# Patient Record
Sex: Male | Born: 1985 | Race: White | Hispanic: No | Marital: Single | Smoking: Current every day smoker
Health system: Southern US, Community
[De-identification: ages and names within clinical notes are randomized; demographics above are authoritative.]

---
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
## 2007-06-05 ENCOUNTER — Emergency Department: Payer: Self-pay | Admitting: Emergency Medicine

## 2007-09-24 ENCOUNTER — Emergency Department: Payer: Self-pay | Admitting: Emergency Medicine

## 2010-09-28 ENCOUNTER — Emergency Department: Payer: Self-pay | Admitting: Internal Medicine

## 2011-08-03 ENCOUNTER — Emergency Department: Payer: Self-pay | Admitting: *Deleted

## 2020-06-28 ENCOUNTER — Emergency Department
Admission: EM | Admit: 2020-06-28 | Discharge: 2020-06-28 | Disposition: A | Discharge status: Home / Self Care | Payer: Self-pay | Attending: Emergency Medicine | Admitting: Emergency Medicine

## 2020-06-28 ENCOUNTER — Other Ambulatory Visit: Payer: Self-pay

## 2020-06-28 ENCOUNTER — Encounter: Payer: Self-pay | Admitting: Emergency Medicine

## 2020-06-28 ENCOUNTER — Emergency Department: Payer: Self-pay

## 2020-06-28 DIAGNOSIS — K852 Alcohol induced acute pancreatitis without necrosis or infection: Secondary | ICD-10-CM | POA: Insufficient documentation

## 2020-06-28 DIAGNOSIS — K2920 Alcoholic gastritis without bleeding: Secondary | ICD-10-CM | POA: Insufficient documentation

## 2020-06-28 DIAGNOSIS — F17200 Nicotine dependence, unspecified, uncomplicated: Secondary | ICD-10-CM | POA: Insufficient documentation

## 2020-06-28 LAB — URINALYSIS, COMPLETE (UACMP) WITH MICROSCOPIC
Bacteria, UA: NONE SEEN
Glucose, UA: NEGATIVE mg/dL
Hgb urine dipstick: NEGATIVE
Ketones, ur: 5 mg/dL — AB
Leukocytes,Ua: NEGATIVE
Nitrite: NEGATIVE
Protein, ur: 100 mg/dL — AB
Specific Gravity, Urine: 1.044 — ABNORMAL HIGH (ref 1.005–1.030)
pH: 5 (ref 5.0–8.0)

## 2020-06-28 LAB — COMPREHENSIVE METABOLIC PANEL
ALT: 15 U/L (ref 0–44)
AST: 21 U/L (ref 15–41)
Albumin: 4.7 g/dL (ref 3.5–5.0)
Alkaline Phosphatase: 82 U/L (ref 38–126)
Anion gap: 10 (ref 5–15)
BUN: 12 mg/dL (ref 6–20)
CO2: 26 mmol/L (ref 22–32)
Calcium: 9.7 mg/dL (ref 8.9–10.3)
Chloride: 100 mmol/L (ref 98–111)
Creatinine, Ser: 0.87 mg/dL (ref 0.61–1.24)
GFR, Estimated: >60 mL/min (ref >60)
Glucose, Bld: 141 mg/dL — ABNORMAL HIGH (ref 70–99)
Potassium: 4.3 mmol/L (ref 3.5–5.1)
Sodium: 136 mmol/L (ref 135–145)
Total Bilirubin: 0.9 mg/dL (ref 0.3–1.2)
Total Protein: 8.5 g/dL — ABNORMAL HIGH (ref 6.5–8.1)

## 2020-06-28 LAB — LIPASE, BLOOD: Lipase: 126 U/L — ABNORMAL HIGH (ref 11–51)

## 2020-06-28 LAB — CBC
HCT: 48.4 % (ref 39.0–52.0)
Hemoglobin: 16.4 g/dL (ref 13.0–17.0)
MCH: 29.9 pg (ref 26.0–34.0)
MCHC: 33.9 g/dL (ref 30.0–36.0)
MCV: 88.3 fL (ref 80.0–100.0)
Platelets: 216 10*3/uL (ref 150–400)
RBC: 5.48 MIL/uL (ref 4.22–5.81)
RDW: 12.5 % (ref 11.5–15.5)
WBC: 10.2 10*3/uL (ref 4.0–10.5)
nRBC: 0 % (ref 0.0–0.2)

## 2020-06-28 MED ORDER — FAMOTIDINE 20 MG PO TABS: 20.0000 mg | ORAL_TABLET | Freq: Two times a day (BID) | ORAL | 0 refills | Status: DC

## 2020-06-28 MED ORDER — DEXTROSE 5 % AND 0.9 % NACL IV BOLUS
1000.0000 mL | Freq: Once | INTRAVENOUS | Status: AC
  Administered 2020-06-28: 1000 mL via INTRAVENOUS
  Filled 2020-06-28: qty 1000

## 2020-06-28 MED ORDER — ONDANSETRON 4 MG PO TBDP: 4.0000 mg | ORAL_TABLET | Freq: Three times a day (TID) | ORAL | 0 refills | Status: DC | PRN

## 2020-06-28 MED ORDER — KETOROLAC TROMETHAMINE 30 MG/ML IJ SOLN
15.0000 mg | INTRAMUSCULAR | Status: AC
  Administered 2020-06-28: 15 mg via INTRAVENOUS
  Filled 2020-06-28: qty 1

## 2020-06-28 MED ORDER — ONDANSETRON HCL 4 MG/2ML IJ SOLN
4.0000 mg | Freq: Once | INTRAMUSCULAR | Status: AC
  Administered 2020-06-28: 4 mg via INTRAVENOUS
  Filled 2020-06-28: qty 2

## 2020-06-28 MED ORDER — PANTOPRAZOLE SODIUM 40 MG IV SOLR
40.0000 mg | Freq: Once | INTRAVENOUS | Status: AC
  Administered 2020-06-28: 40 mg via INTRAVENOUS
  Filled 2020-06-28: qty 40

## 2020-06-28 MED ORDER — ALUMINUM-MAGNESIUM-SIMETHICONE 200-200-20 MG/5ML PO SUSP: 30.0000 mL | Freq: Three times a day (TID) | ORAL | 0 refills | Status: DC

## 2020-06-28 NOTE — ED Triage Notes (Signed)
Pt comes into the ED via POV c/o abdominal pain, diarrhea, and vomiting blood that started on Tuesday.  Pt denies any blood thinner use.  Pt denies any alcohol problems.  Pt states that he saw blood clots in his vomit after drinking alcohol over the mothers day weekend. Pt does have known ulcers.

## 2020-06-28 NOTE — ED Provider Notes (Signed)
[facility] Emergency Department Provider Note  ____________________________________________  Time seen: Approximately 1:08 PM  I have reviewed the triage vital signs and the nursing notes.   HISTORY  Chief Complaint Abdominal Pain and Hematemesis    HPI Todd Wyatt is a 35 y.o. male with no significant past medical history who comes ED complaining of upper abdominal pain and vomiting for the past 3 days which started after a 3-day weekend of heavy drinking of moonshine and using hallucinogenic mushrooms.  Denies chest pain or shortness of breath.  Has been eating very little over the past 6 days.  Not able to tolerate liquids the last few days.  Denies black or bloody stool.  Patient notes that with his frequent vomiting, he started to see small streaks of blood in his emesis.  Symptoms are waxing and waning, constant, pain is moderate, aching.  No alleviating factors.      History reviewed. No pertinent past medical history.   There are no problems to display for this patient.    History reviewed. No pertinent surgical history.   Prior to Admission medications   Medication Sig Start Date End Date Taking? Authorizing Provider  aluminum-magnesium hydroxide-simethicone (MAALOX) 200-200-20 MG/5ML SUSP Take 30 mLs by mouth 4 (four) times daily -  before meals and at bedtime. 06/28/20  Yes Sharman Cheek, MD  famotidine (PEPCID) 20 MG tablet Take 1 tablet (20 mg total) by mouth 2 (two) times daily. 06/28/20  Yes Sharman Cheek, MD  ondansetron (ZOFRAN ODT) 4 MG disintegrating tablet Take 1 tablet (4 mg total) by mouth every 8 (eight) hours as needed for nausea or vomiting. 06/28/20  Yes Sharman Cheek, MD     Allergies Patient has no known allergies.   History reviewed. No pertinent family history.  Social History Social History   Tobacco Use  . Smoking status: Current Every Day Smoker  . Smokeless tobacco: Never Used  Substance Use  Topics  . Alcohol use: Yes  . Drug use: Yes    Types: Marijuana    Comment: mushrooms    Review of Systems  Constitutional:   No fever or chills.  ENT:   No sore throat. No rhinorrhea. Cardiovascular:   No chest pain or syncope. Respiratory:   No dyspnea or cough. Gastrointestinal:   Positive as above for abdominal pain, vomiting and diarrhea.  Musculoskeletal:   Negative for focal pain or swelling All other systems reviewed and are negative except as documented above in ROS and HPI.  ____________________________________________   PHYSICAL EXAM:  VITAL SIGNS: ED Triage Vitals [06/28/20 1044]  Enc Vitals Group     BP (!) 132/98     Pulse Rate (!) 111     Resp 18     Temp 98 F (36.7 C)     Temp Source Oral     SpO2 98 %     Weight 180 lb (81.6 kg)     Height 5\' 11"  (1.803 m)     Head Circumference      Peak Flow      Pain Score 1     Pain Loc      Pain Edu?      Excl. in GC?     Vital signs reviewed, nursing assessments reviewed.   Constitutional:   Alert and oriented. Non-toxic appearance. Eyes:   Conjunctivae are normal. EOMI. PERRL. ENT      Head:   Normocephalic and atraumatic.      Nose: Normal.  Mouth/Throat: Dry mucous membranes.      Neck:   No meningismus. Full ROM. Hematological/Lymphatic/Immunilogical:   No cervical lymphadenopathy. Cardiovascular:   RRR. Symmetric bilateral radial and DP pulses.  No murmurs. Cap refill less than 2 seconds. Respiratory:   Normal respiratory effort without tachypnea/retractions. Breath sounds are clear and equal bilaterally. No wheezes/rales/rhonchi. Gastrointestinal:   Soft with epigastric and right upper quadrant tenderness. Non distended. There is no CVA tenderness.  No rebound, rigidity, or guarding. Genitourinary:   deferred Musculoskeletal:   Normal range of motion in all extremities. No joint effusions.  No lower extremity tenderness.  No edema. Neurologic:   Normal speech and language.  Motor grossly  intact. No acute focal neurologic deficits are appreciated.  Skin:    Skin is warm, dry and intact. No rash noted.  No petechiae, purpura, or bullae.  ____________________________________________    LABS (pertinent positives/negatives) (all labs ordered are listed, but only abnormal results are displayed) Labs Reviewed  LIPASE, BLOOD - Abnormal; Notable for the following components:      Result Value   Lipase 126 (*)    All other components within normal limits  COMPREHENSIVE METABOLIC PANEL - Abnormal; Notable for the following components:   Glucose, Bld 141 (*)    Total Protein 8.5 (*)    All other components within normal limits  URINALYSIS, COMPLETE (UACMP) WITH MICROSCOPIC - Abnormal; Notable for the following components:   Color, Urine AMBER (*)    APPearance HAZY (*)    Specific Gravity, Urine 1.044 (*)    Bilirubin Urine MODERATE (*)    Ketones, ur 5 (*)    Protein, ur 100 (*)    All other components within normal limits  CBC   ____________________________________________   EKG    ____________________________________________    RADIOLOGY  US ABDOMEN LIMITED RUQ (LIVER/GB)  Result Date: 06/28/2020 CLINICAL DATA:  Upper abdominal pain.  Vomiting for 3 days. EXAM: ULTRASOUND ABDOMEN LIMITED RIGHT UPPER QUADRANT COMPARISON:  None. FINDINGS: Gallbladder: No gallstones or wall thickening visualized. No sonographic Murphy sign noted by sonographer. Common bile duct: Diameter: 4.4 mm Liver: No focal lesion identified. Within normal limits in parenchymal echogenicity. Portal vein is patent on color Doppler imaging with normal direction of blood flow towards the liver. Other: None. IMPRESSION: No abnormalities identified.  Normal study. Electronically Signed   By: Gerome Sam III M.D   On: 06/28/2020 12:43    ____________________________________________   PROCEDURES Procedures  ____________________________________________  DIFFERENTIAL DIAGNOSIS    Cholecystitis, choledocholithiasis, pancreatitis, alcoholic gastritis  CLINICAL IMPRESSION / ASSESSMENT AND PLAN / ED COURSE  Medications ordered in the ED: Medications  dextrose 5 % and 0.9% NaCl 5-0.9 % bolus 1,000 mL (1,000 mLs Intravenous New Bag/Given 06/28/20 1240)  ondansetron (ZOFRAN) injection 4 mg (4 mg Intravenous Given 06/28/20 1211)  pantoprazole (PROTONIX) injection 40 mg (40 mg Intravenous Given 06/28/20 1219)  ketorolac (TORADOL) 30 MG/ML injection 15 mg (15 mg Intravenous Given 06/28/20 1219)    Pertinent labs & imaging results that were available during my care of the patient were reviewed by me and considered in my medical decision making (see chart for details).  Todd Wyatt was evaluated in Emergency Department on 06/28/2020 for the symptoms described in the history of present illness. He was evaluated in the context of the global COVID-19 pandemic, which necessitated consideration that the patient might be at risk for infection with the SARS-CoV-2 virus that causes COVID-19. Institutional protocols and algorithms that pertain to the  evaluation of patients at risk for COVID-19 are in a state of rapid change based on information released by regulatory bodies including the CDC and federal and state organizations. These policies and algorithms were followed during the patient's care in the ED.   Patient presents with upper abdominal pain and vomiting after heavy drinking episode..  With his abdominal tenderness, ultrasound obtained which is negative for cholecystitis or other acute findings.  Abdomen is otherwise nonsurgical.  Labs are significant for a mildly elevated lipase indicative of a degree of alcoholic pancreatitis, and urinalysis consistent with dehydration and starvation.  He is not acidotic, nontoxic-appearing.  Patient given IV fluids, antiemetics and antacids, will p.o. trial and if able to tolerate oral intake he will stable for discharge home.  Counseled patient  for 6 minutes on lifestyle modification and cutting back or abstaining from alcohol use.   ----------------------------------------- 3:20 PM on 06/28/2020 -----------------------------------------  Feeling better, tolerating p.o.  Wishes to be discharged.         ____________________________________________   FINAL CLINICAL IMPRESSION(S) / ED DIAGNOSES    Final diagnoses:  Acute alcoholic gastritis without hemorrhage  Alcohol-induced acute pancreatitis, unspecified complication status     ED Discharge Orders         Ordered    famotidine (PEPCID) 20 MG tablet  2 times daily        06/28/20 1519    ondansetron (ZOFRAN ODT) 4 MG disintegrating tablet  Every 8 hours PRN        06/28/20 1519    aluminum-magnesium hydroxide-simethicone (MAALOX) 200-200-20 MG/5ML SUSP  3 times daily before meals & bedtime        06/28/20 1519          Portions of this note were generated with dragon dictation software. Dictation errors may occur despite best attempts at proofreading.   Sharman Cheek, MD 06/28/20 1520

## 2020-06-28 NOTE — ED Notes (Signed)
Provided DC instructions. Verbalized understanding.  

## 2022-01-02 ENCOUNTER — Emergency Department
Admission: EM | Admit: 2022-01-02 | Discharge: 2022-01-02 | Disposition: A | Discharge status: Home / Self Care | Payer: Self-pay | Attending: Emergency Medicine | Admitting: Emergency Medicine

## 2022-01-02 ENCOUNTER — Encounter: Payer: Self-pay | Admitting: Emergency Medicine

## 2022-01-02 ENCOUNTER — Other Ambulatory Visit: Payer: Self-pay

## 2022-01-02 DIAGNOSIS — R112 Nausea with vomiting, unspecified: Secondary | ICD-10-CM

## 2022-01-02 DIAGNOSIS — K529 Noninfective gastroenteritis and colitis, unspecified: Secondary | ICD-10-CM | POA: Insufficient documentation

## 2022-01-02 DIAGNOSIS — Z20822 Contact with and (suspected) exposure to covid-19: Secondary | ICD-10-CM | POA: Insufficient documentation

## 2022-01-02 DIAGNOSIS — R824 Acetonuria: Secondary | ICD-10-CM | POA: Insufficient documentation

## 2022-01-02 LAB — COMPREHENSIVE METABOLIC PANEL
ALT: 26 U/L (ref 0–44)
AST: 32 U/L (ref 15–41)
Albumin: 4.7 g/dL (ref 3.5–5.0)
Alkaline Phosphatase: 72 U/L (ref 38–126)
Anion gap: 12 (ref 5–15)
BUN: 13 mg/dL (ref 6–20)
CO2: 21 mmol/L — ABNORMAL LOW (ref 22–32)
Calcium: 9.4 mg/dL (ref 8.9–10.3)
Chloride: 104 mmol/L (ref 98–111)
Creatinine, Ser: 0.77 mg/dL (ref 0.61–1.24)
GFR, Estimated: >60 mL/min (ref >60)
Glucose, Bld: 121 mg/dL — ABNORMAL HIGH (ref 70–99)
Potassium: 3.7 mmol/L (ref 3.5–5.1)
Sodium: 137 mmol/L (ref 135–145)
Total Bilirubin: 0.6 mg/dL (ref 0.3–1.2)
Total Protein: 8.3 g/dL — ABNORMAL HIGH (ref 6.5–8.1)

## 2022-01-02 LAB — URINALYSIS, ROUTINE W REFLEX MICROSCOPIC
Bilirubin Urine: NEGATIVE
Glucose, UA: NEGATIVE mg/dL
Hgb urine dipstick: NEGATIVE
Ketones, ur: 20 mg/dL — AB
Leukocytes,Ua: NEGATIVE
Nitrite: NEGATIVE
Protein, ur: 100 mg/dL — AB
Specific Gravity, Urine: 1.032 — ABNORMAL HIGH (ref 1.005–1.030)
Squamous Epithelial / HPF: NONE SEEN (ref 0–5)
pH: 7 (ref 5.0–8.0)

## 2022-01-02 LAB — RESP PANEL BY RT-PCR (FLU A&B, COVID) ARPGX2
Influenza A by PCR: NEGATIVE
Influenza B by PCR: POSITIVE — AB
SARS Coronavirus 2 by RT PCR: NEGATIVE

## 2022-01-02 LAB — CBC
HCT: 45.9 % (ref 39.0–52.0)
Hemoglobin: 16 g/dL (ref 13.0–17.0)
MCH: 30.1 pg (ref 26.0–34.0)
MCHC: 34.9 g/dL (ref 30.0–36.0)
MCV: 86.4 fL (ref 80.0–100.0)
Platelets: 179 10*3/uL (ref 150–400)
RBC: 5.31 MIL/uL (ref 4.22–5.81)
RDW: 12.6 % (ref 11.5–15.5)
WBC: 5.6 10*3/uL (ref 4.0–10.5)
nRBC: 0 % (ref 0.0–0.2)

## 2022-01-02 LAB — TYPE AND SCREEN
ABO/RH(D): A POS
Antibody Screen: NEGATIVE

## 2022-01-02 LAB — LIPASE, BLOOD: Lipase: 29 U/L (ref 11–51)

## 2022-01-02 MED ORDER — ONDANSETRON 4 MG PO TBDP: 4.0000 mg | ORAL_TABLET | Freq: Three times a day (TID) | ORAL | 0 refills | Status: AC | PRN

## 2022-01-02 MED ORDER — FAMOTIDINE 20 MG PO TABS
20.0000 mg | ORAL_TABLET | Freq: Once | ORAL | Status: AC
  Administered 2022-01-02: 20 mg via ORAL
  Filled 2022-01-02: qty 1

## 2022-01-02 MED ORDER — OMEPRAZOLE MAGNESIUM 20 MG PO TBEC: 20.0000 mg | DELAYED_RELEASE_TABLET | Freq: Every day | ORAL | 3 refills | Status: AC

## 2022-01-02 MED ORDER — ONDANSETRON 4 MG PO TBDP
4.0000 mg | ORAL_TABLET | Freq: Once | ORAL | Status: AC
  Administered 2022-01-02: 4 mg via ORAL
  Filled 2022-01-02: qty 1

## 2022-01-02 NOTE — ED Provider Notes (Signed)
[facility] Provider Note    Event Date/Time   First MD Initiated Contact with Patient 01/02/22 1103     (approximate)   History   Emesis   HPI  Todd Wyatt is a 36 y.o. male with past medical history significant for marijuana use, presents to the emergency department with upper abdominal pain.  Patient endorses 1 day of upper abdominal pain with multiple episodes of nausea and vomiting that started this morning around 4 AM.  Does state that he drank a pitcher of margaritas this past weekend but states he felt fine over the past couple of days.  Does state that he had a significant history of alcohol use and had alcoholic pancreatitis in May but has not drink much alcohol since that time.  States that he is cut back to only having a couple of beers or liquor drinks twice a month.  Does endorse daily marijuana use.  Denies any other drug use.  Today states he had multiple episodes of coughing up dark red blood.  Denies any blood in his stool.  Does state that he has a history of acid reflux.  No history of an endoscopy.  Not on anticoagulation.  Denies family history of colon cancer at a young age.     Physical Exam   Triage Vital Signs: ED Triage Vitals [01/02/22 1024]  Enc Vitals Group     BP (!) 128/95     Pulse Rate 83     Resp 18     Temp 98.3 F (36.8 C)     Temp Source Oral     SpO2 98 %     Weight 179 lb 14.3 oz (81.6 kg)     Height 5\' 11"  (1.803 m)     Head Circumference      Peak Flow      Pain Score 10     Pain Loc      Pain Edu?      Excl. in GC?     Most recent vital signs: Vitals:   01/02/22 1024  BP: (!) 128/95  Pulse: 83  Resp: 18  Temp: 98.3 F (36.8 C)  SpO2: 98%    Physical Exam Constitutional:      Appearance: He is well-developed.  HENT:     Head: Atraumatic.  Eyes:     Conjunctiva/sclera: Conjunctivae normal.  Cardiovascular:     Rate and Rhythm: Regular rhythm.  Pulmonary:     Effort: No respiratory  distress.  Abdominal:     General: There is no distension.     Tenderness: There is abdominal tenderness (epigastric). There is no right CVA tenderness or left CVA tenderness.     Comments: Neg murphy  Genitourinary:    Comments: DRE with no gross blood or melena Musculoskeletal:     Cervical back: Normal range of motion.  Skin:    General: Skin is warm.     Capillary Refill: Capillary refill takes less than 2 seconds.  Neurological:     Mental Status: He is alert. Mental status is at baseline.          IMPRESSION / MDM / ASSESSMENT AND PLAN / ED COURSE  I reviewed the triage vital signs and the nursing notes.  Differential diagnosis including pancreatitis, gastritis/PUD, viral illness with COVID or influenza, pyelonephritis, kidney stone, cannabis hyperemesis syndrome  Patient was given p.o. ondansetron and Pepcid.       ED Results / Procedures / Treatments  Labs (all labs ordered are listed, but only abnormal results are displayed) Labs interpreted as -   Lab work with no signs of urinary tract infection.  Does have some ketones in his urine.  Lipase normal have a low suspicion for acute pancreatitis.  No significant electrolyte abnormalities.  Glucose within normal limits.  Kidney function within normal limits.  Hemoglobin within normal limits.  No significantly elevated BUN.  No gross blood or melena on rectal exam.  Low suspicion for significant upper GI bleed.  Labs Reviewed  COMPREHENSIVE METABOLIC PANEL - Abnormal; Notable for the following components:      Result Value   CO2 21 (*)    Glucose, Bld 121 (*)    Total Protein 8.3 (*)    All other components within normal limits  URINALYSIS, ROUTINE W REFLEX MICROSCOPIC - Abnormal; Notable for the following components:   Color, Urine AMBER (*)    APPearance HAZY (*)    Specific Gravity, Urine 1.032 (*)    Ketones, ur 20 (*)    Protein, ur 100 (*)    Bacteria, UA FEW (*)    All other components within  normal limits  RESP PANEL BY RT-PCR (FLU A&B, COVID) ARPGX2  LIPASE, BLOOD  CBC  TYPE AND SCREEN    12:57 PM On reevaluation patient tolerating p.o.  Repeat abdominal exam continues to be benign with no signs of significant tenderness.  Low suspicion for symptomatic cholelithiasis or acute cholecystitis.  No lower abdominal tenderness consistent with acute appendicitis.  Mostly with gas-itis versus a hyperemesis syndrome.  Discussed follow-up with primary care physician and given information to establish care.  Started on a PPI and given a prescription for Zofran.  Given return precautions for any worsening symptoms.   PROCEDURES:  Critical Care performed: No  Procedures  Patient's presentation is most consistent with acute presentation with potential threat to life or bodily function.   MEDICATIONS ORDERED IN ED: Medications  ondansetron (ZOFRAN-ODT) disintegrating tablet 4 mg (4 mg Oral Given 01/02/22 1145)  famotidine (PEPCID) tablet 20 mg (20 mg Oral Given 01/02/22 1146)    FINAL CLINICAL IMPRESSION(S) / ED DIAGNOSES   Final diagnoses:  Gastroenteritis  Nausea and vomiting, unspecified vomiting type     Rx / DC Orders   ED Discharge Orders          Ordered    ondansetron (ZOFRAN-ODT) 4 MG disintegrating tablet  Every 8 hours PRN        01/02/22 1251    omeprazole (PRILOSEC OTC) 20 MG tablet  Daily        01/02/22 1251             Note:  This document was prepared using Dragon voice recognition software and may include unintentional dictation errors.   Corena Herter, MD 01/02/22 1258

## 2022-01-02 NOTE — ED Triage Notes (Signed)
Pt here with coffee ground emesis. Pt seldom drinks alcohol. Pt having severe abd pain.

## 2022-01-02 NOTE — ED Notes (Signed)
See triage note.  States he developed some body aches and subjective fever  Woke up with n/v this am

## 2022-01-29 IMAGING — US US ABDOMEN LIMITED RUQ/ASCITES
1 series · 15 of 25 positions shown · non-contrast
Comparison: None.

CLINICAL DATA: Upper abdominal pain.  Vomiting for 3 days.

EXAM:
ULTRASOUND ABDOMEN LIMITED RIGHT UPPER QUADRANT

[Series 1: us abdomen limited ruq · 15 of 57 slices shown]
[im 1/57]
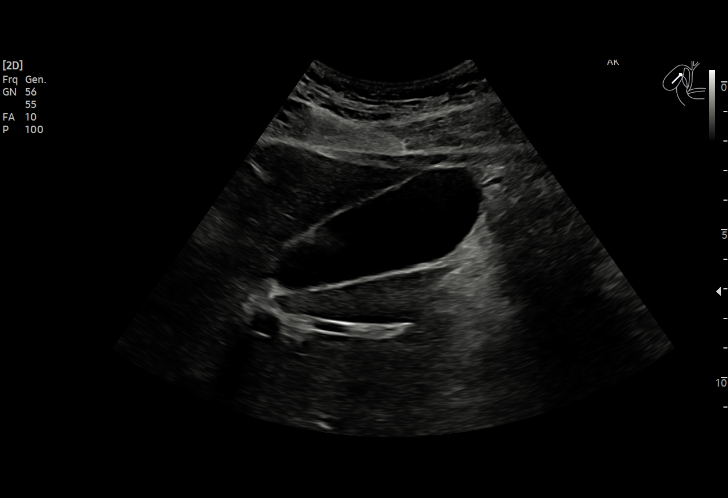
[im 5/57]
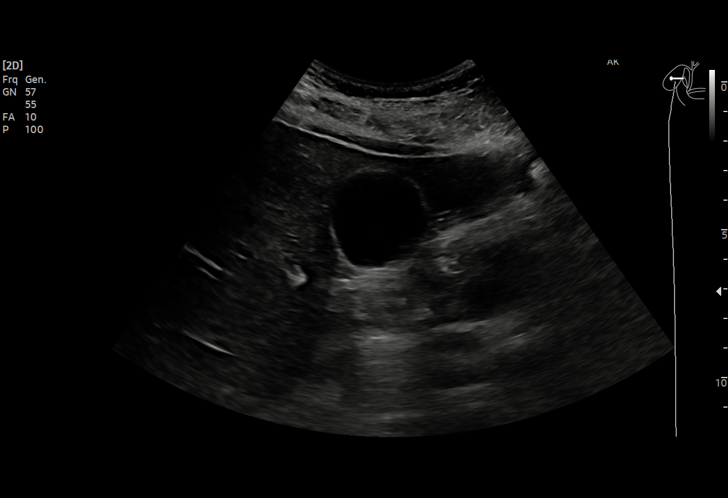
[im 10/57]
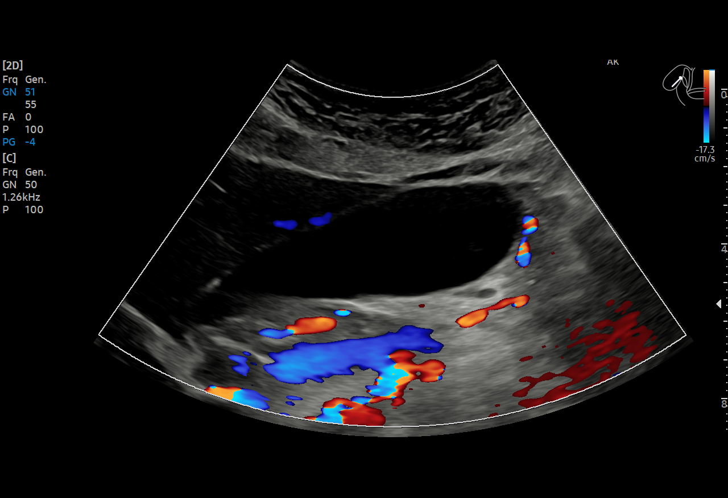
[im 12/57]
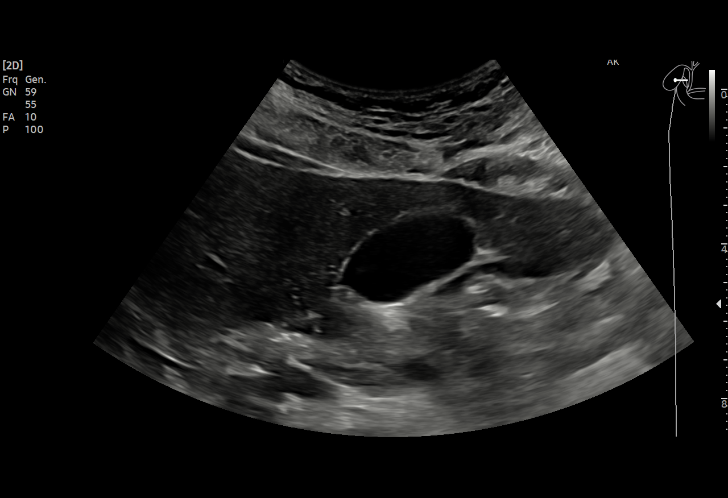
[im 17/57]
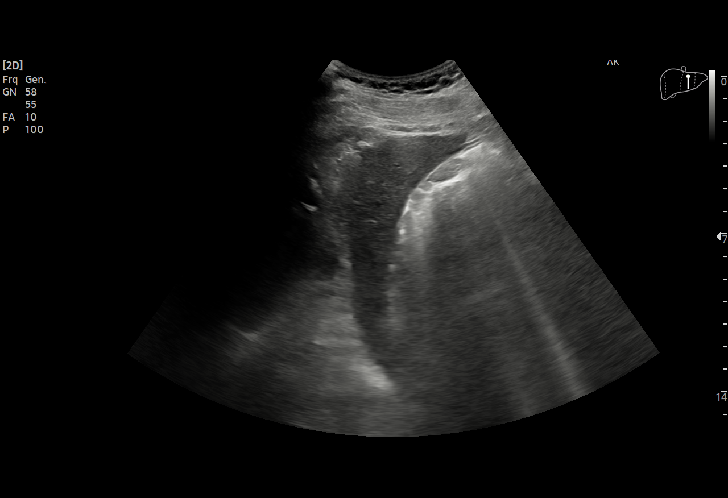
[im 22/57]
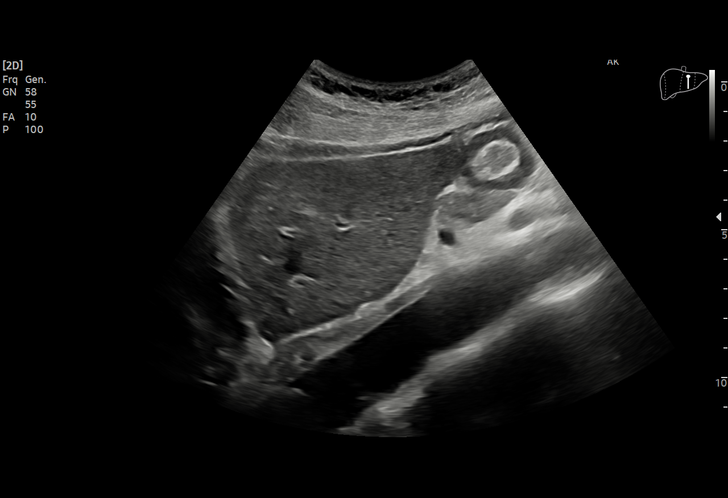
[im 24/57]
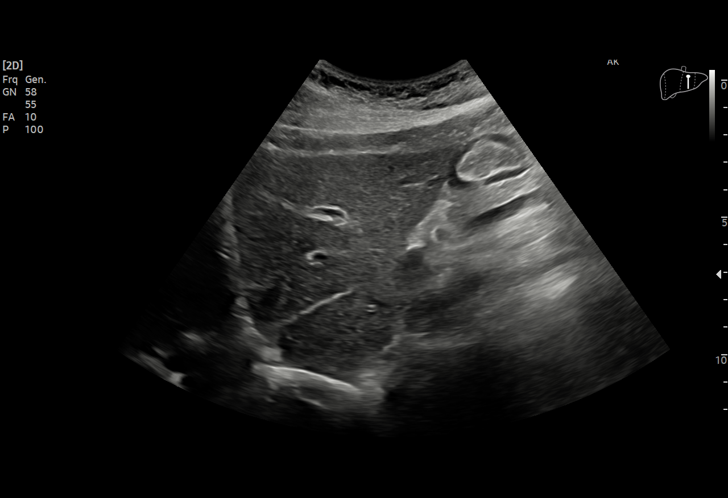
[im 29/57]
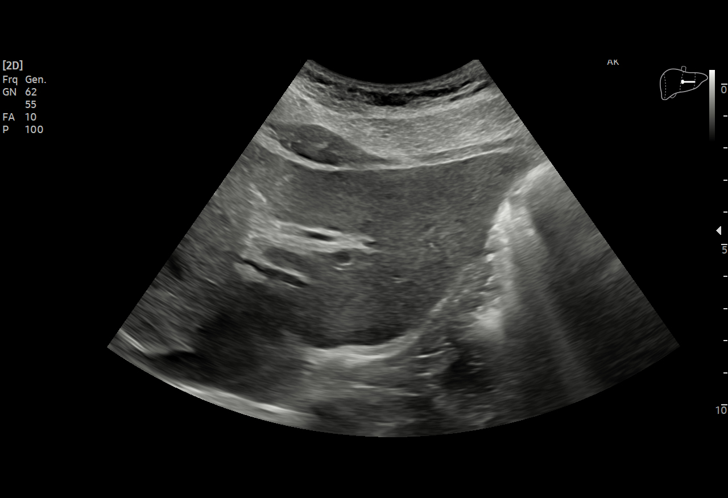
[im 33/57]
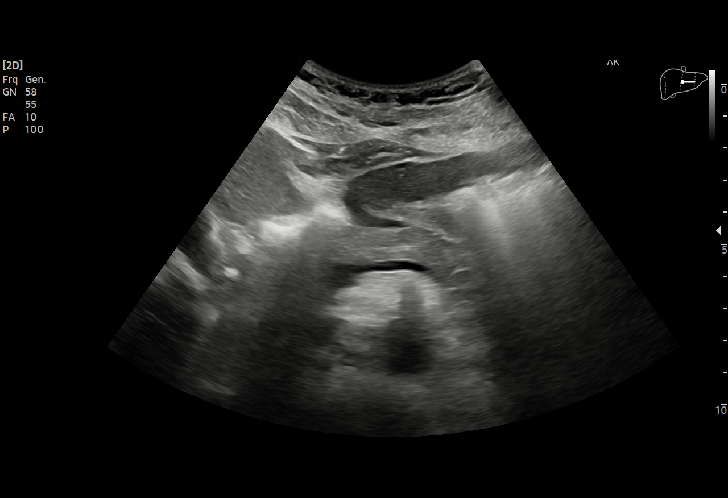
[im 36/57]
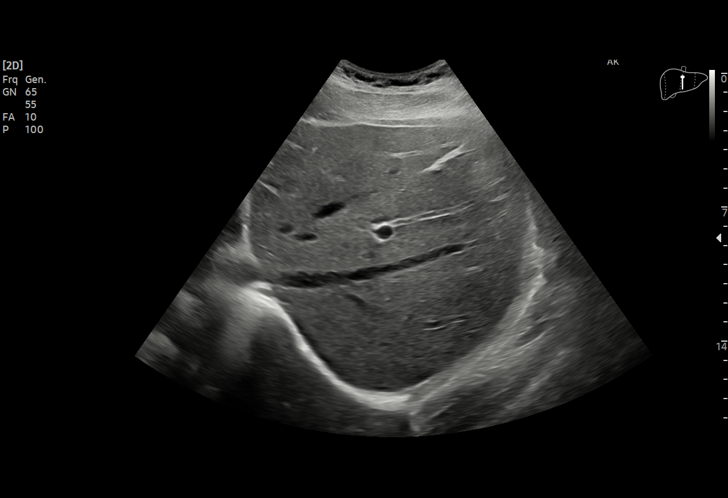
[im 40/57]
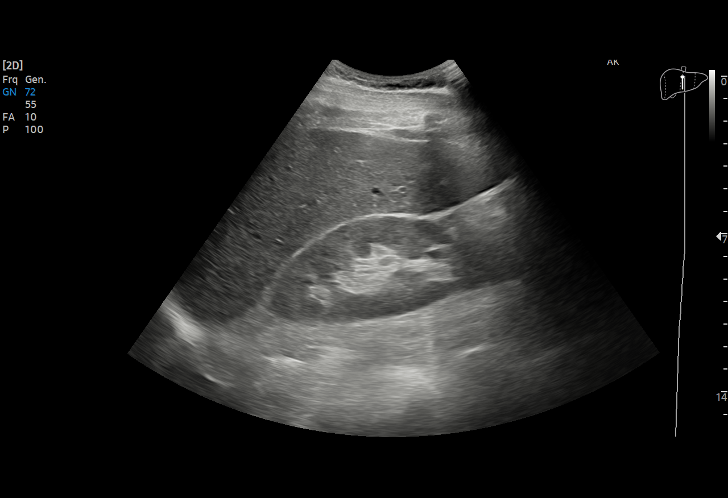
[im 45/57]
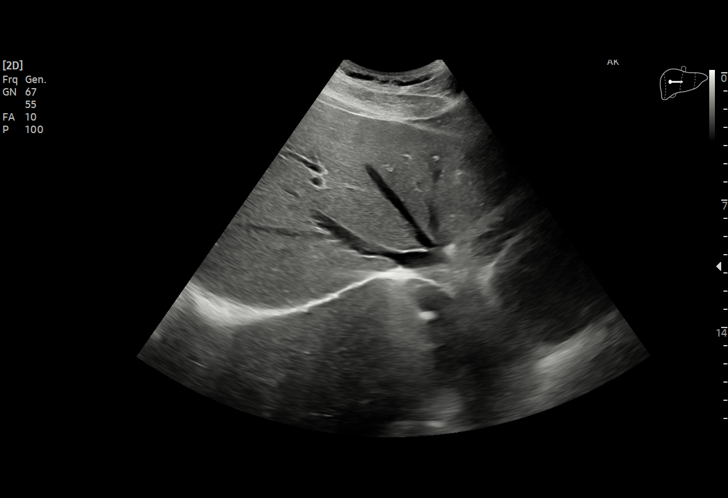
[im 47/57]
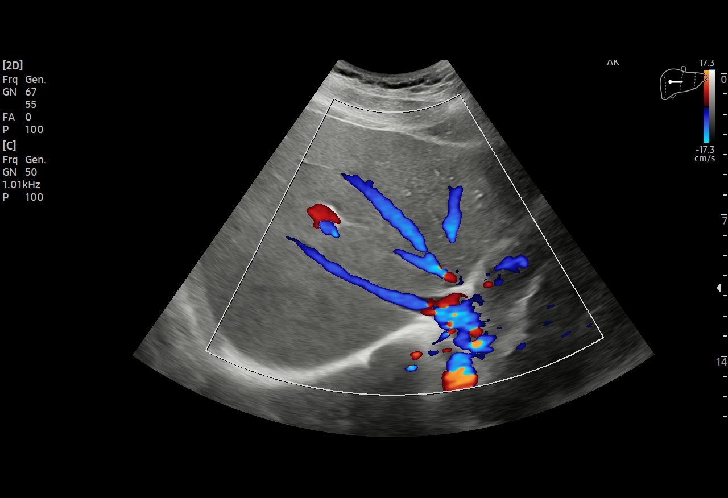
[im 52/57]
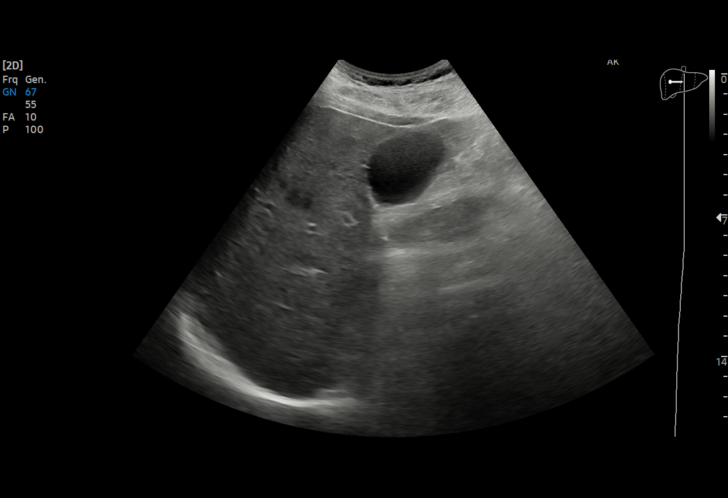
[im 57/57]
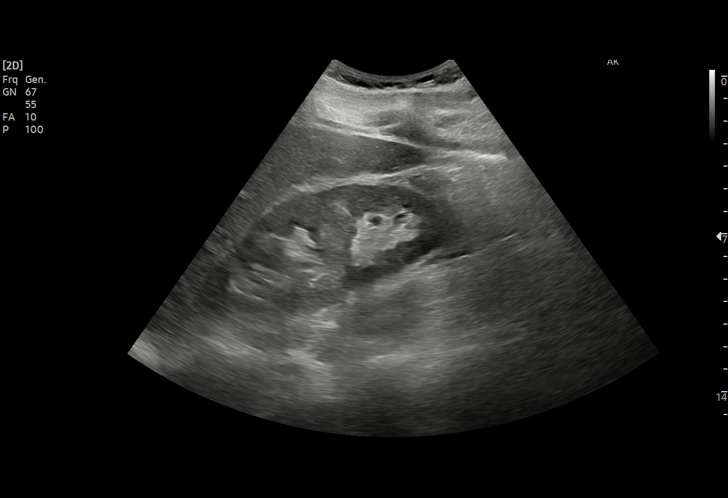

[15 of 25 positions shown; findings below may reference images not displayed]

FINDINGS: Gallbladder:

No gallstones or wall thickening visualized. No sonographic Murphy
sign noted by sonographer.

Common bile duct:

Diameter: 4.4 mm

Liver:

No focal lesion identified. Within normal limits in parenchymal
echogenicity. Portal vein is patent on color Doppler imaging with
normal direction of blood flow towards the liver.

Other: None.
IMPRESSION: No abnormalities identified.  Normal study.
